# Patient Record
Sex: Female | Born: 1985 | Race: Black or African American | Hispanic: No | Marital: Single | Smoking: Never smoker
Health system: Southern US, Community
[De-identification: ages and names within clinical notes are randomized; demographics above are authoritative.]

---

## 2013-05-12 ENCOUNTER — Emergency Department (HOSPITAL_COMMUNITY): Payer: BC Managed Care – PPO

## 2013-05-12 ENCOUNTER — Emergency Department (HOSPITAL_COMMUNITY)
Admission: EM | Admit: 2013-05-12 | Discharge: 2013-05-12 | Disposition: A | Discharge status: Home / Self Care | Payer: BC Managed Care – PPO | Attending: Emergency Medicine | Admitting: Emergency Medicine

## 2013-05-12 ENCOUNTER — Encounter (HOSPITAL_COMMUNITY): Payer: Self-pay | Admitting: Emergency Medicine

## 2013-05-12 DIAGNOSIS — S93401A Sprain of unspecified ligament of right ankle, initial encounter: Secondary | ICD-10-CM

## 2013-05-12 DIAGNOSIS — Y9302 Activity, running: Secondary | ICD-10-CM | POA: Insufficient documentation

## 2013-05-12 DIAGNOSIS — W010XXA Fall on same level from slipping, tripping and stumbling without subsequent striking against object, initial encounter: Secondary | ICD-10-CM | POA: Insufficient documentation

## 2013-05-12 DIAGNOSIS — Y929 Unspecified place or not applicable: Secondary | ICD-10-CM | POA: Insufficient documentation

## 2013-05-12 DIAGNOSIS — S93409A Sprain of unspecified ligament of unspecified ankle, initial encounter: Secondary | ICD-10-CM | POA: Insufficient documentation

## 2013-05-12 NOTE — ED Notes (Addendum)
Pt reports slipped and fell this am in the rain. Strained R ankle. C/o ankle pain and swelling, swelling noted, some bruising present. H/o similar multiple times. Pain mostly at ankle. Wearing flip flops (sandles). No meds PTA. CMS intact.

## 2013-05-12 NOTE — ED Notes (Signed)
Patient transported to X-ray 

## 2013-05-12 NOTE — ED Notes (Signed)
PT ambulated with baseline gait; VSS; A&Ox3; no signs of distress; respirations even and unlabored; skin warm and dry; no questions upon discharge.  

## 2013-05-12 NOTE — ED Notes (Signed)
Ortho tech at bedside 

## 2013-05-12 NOTE — ED Notes (Signed)
Ortho paged. 

## 2013-05-12 NOTE — Progress Notes (Signed)
Orthopedic Tech Progress Note Patient Details:  Burnard BuntingObianuju Grzelak 06-23-1985 [id number]  Ortho Devices Type of Ortho Device: CAM walker;Crutches Ortho Device/Splint Location: RLE Ortho Device/Splint Interventions: Ordered;Application;Adjustment   Jennye MoccasinHughes, Nalia Honeycutt Craig 05/12/2013, 10:54 PM

## 2013-05-12 NOTE — ED Provider Notes (Signed)
CSN: [id number]     Arrival date & time 05/12/13  2017 History   This chart was scribed for non-physician practitioner Arthor CaptainAbigail Houston Surges, PA-C, working with Toy BakerAnthony T Allen, MD by Nicholos Johnsenise Iheanachor, ED scribe. This patient was seen in room TR09C/TR09C and the patient's care was started at 10:17 PM.  Chief Complaint  Patient presents with  . Ankle Pain  . Joint Swelling   The history is provided by the patient. No language interpreter was used.   HPI Comments: Mercedes Jones is a 28 y.o. female who presents to the Emergency Department complaining of constant, gradually worsening right ankle pain w/ associated swelling. Pt states the foot feels tense and hurts with applied pressure. Pt was running to class and slid in mud and fell.  History reviewed. No pertinent past medical history. History reviewed. No pertinent past surgical history. No family history on file. History  Substance Use Topics  . Smoking status: Never Smoker   . Smokeless tobacco: Not on file  . Alcohol Use: No   OB History   Grav Para Term Preterm Abortions TAB SAB Ect Mult Living                 Review of Systems  Constitutional: Negative for fever and chills.  HENT: Negative for rhinorrhea and sore throat.   Musculoskeletal: Positive for arthralgias. Negative for myalgias.  Skin: Negative for color change and rash.  Neurological: Negative for weakness and numbness.    Allergies  Review of patient's allergies indicates no known allergies.  Home Medications  No current outpatient prescriptions on file. Triage Vitals: BP 124/74  Pulse 78  Temp(Src) 98.9 F (37.2 C) (Oral)  Resp 20  SpO2 97%  LMP 04/11/2013 Physical Exam  Nursing note and vitals reviewed. Constitutional: She is oriented to person, place, and time. She appears well-developed and well-nourished. No distress.  HENT:  Head: Normocephalic and atraumatic.  Eyes: Conjunctivae and EOM are normal. Pupils are equal, round, and reactive to light.   Neck: Neck supple. No thyromegaly present.  Cardiovascular: Normal rate.   Pulmonary/Chest: Effort normal. No respiratory distress.  Musculoskeletal: Normal range of motion.  Lymphadenopathy:    She has no cervical adenopathy.  Neurological: She is alert and oriented to person, place, and time.  Skin: Skin is warm and dry.  Psychiatric: She has a normal mood and affect. Her behavior is normal.    ED Course  Procedures (including critical care time) DIAGNOSTIC STUDIES: Oxygen Saturation is 97% on room air, normal by my interpretation.    COORDINATION OF CARE: At 10:19 PM: Discussed treatment plan with patient which includes a boot and crutches to avoid bearing weight. Pt is advised to elevate the foot to decrease pain. Patient agrees.   Labs Review Labs Reviewed - No data to display Imaging Review Dg Ankle Complete Right  05/12/2013   CLINICAL DATA:  Twisting ankle injury with lateral pain  EXAM: RIGHT ANKLE - COMPLETE 3+ VIEW  COMPARISON:  None.  FINDINGS: Periarticular soft tissue swelling, especially laterally. No evidence of fracture or malalignment. No joint narrowing.  IMPRESSION: No acute osseous abnormality.   Electronically Signed   By: Tiburcio PeaJonathan  Watts M.D.   On: 05/12/2013 22:07    EKG Interpretation   None       MDM   1. Right ankle sprain    Patient X-Ray negative for obvious fracture or dislocation. Pain managed in ED. Pt advised to follow up with orthopedics if symptoms persist for possibility of  missed fracture diagnosis. Patient given brace while in ED, conservative therapy recommended and discussed. Patient will be dc home & is agreeable with above plan.  I personally performed the services described in this documentation, which was scribed in my presence. The recorded information has been reviewed and is accurate.       Arthor Captain, PA-C 05/14/13 1109

## 2013-05-12 NOTE — ED Notes (Signed)
Ice applied, declined pain med.

## 2013-05-12 NOTE — Discharge Instructions (Signed)

## 2013-05-17 NOTE — ED Provider Notes (Signed)
Medical screening examination/treatment/procedure(s) were performed by non-physician practitioner and as supervising physician I was immediately available for consultation/collaboration.  Saksham Akkerman T Xitlali Kastens, MD 05/17/13 1644 

## 2015-06-26 IMAGING — CR DG ANKLE COMPLETE 3+V*R*
3 series · 3 of 3 positions shown · non-contrast
Comparison: None.

CLINICAL DATA: Twisting ankle injury with lateral pain

EXAM:
RIGHT ANKLE - COMPLETE 3+ VIEW

[x ankle ap right]
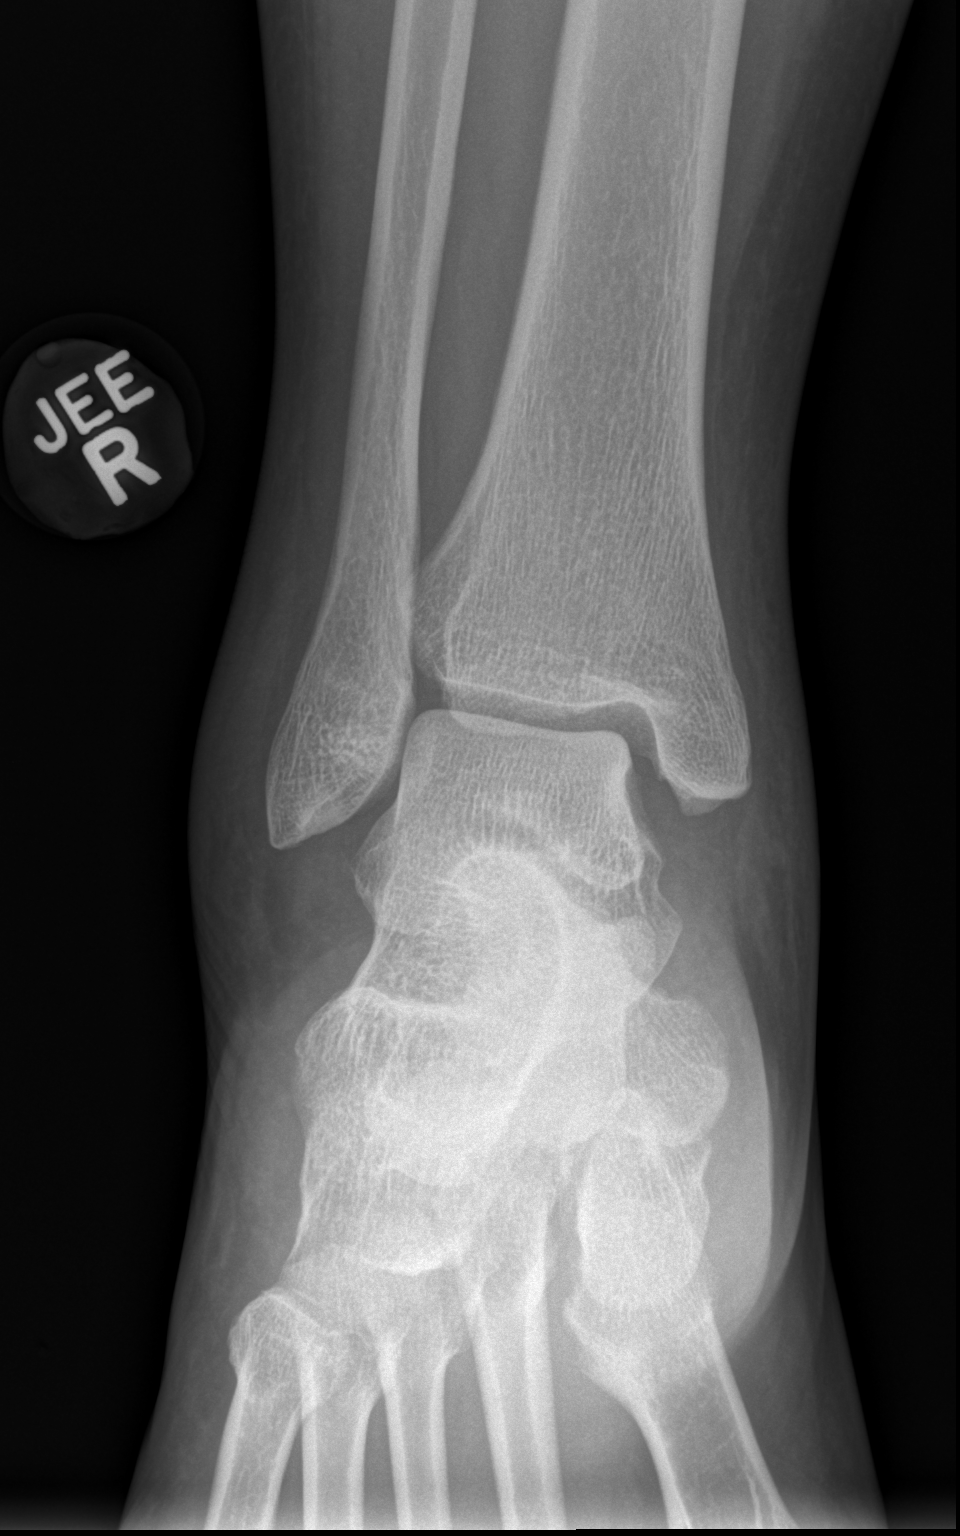

[x ankle obl right]
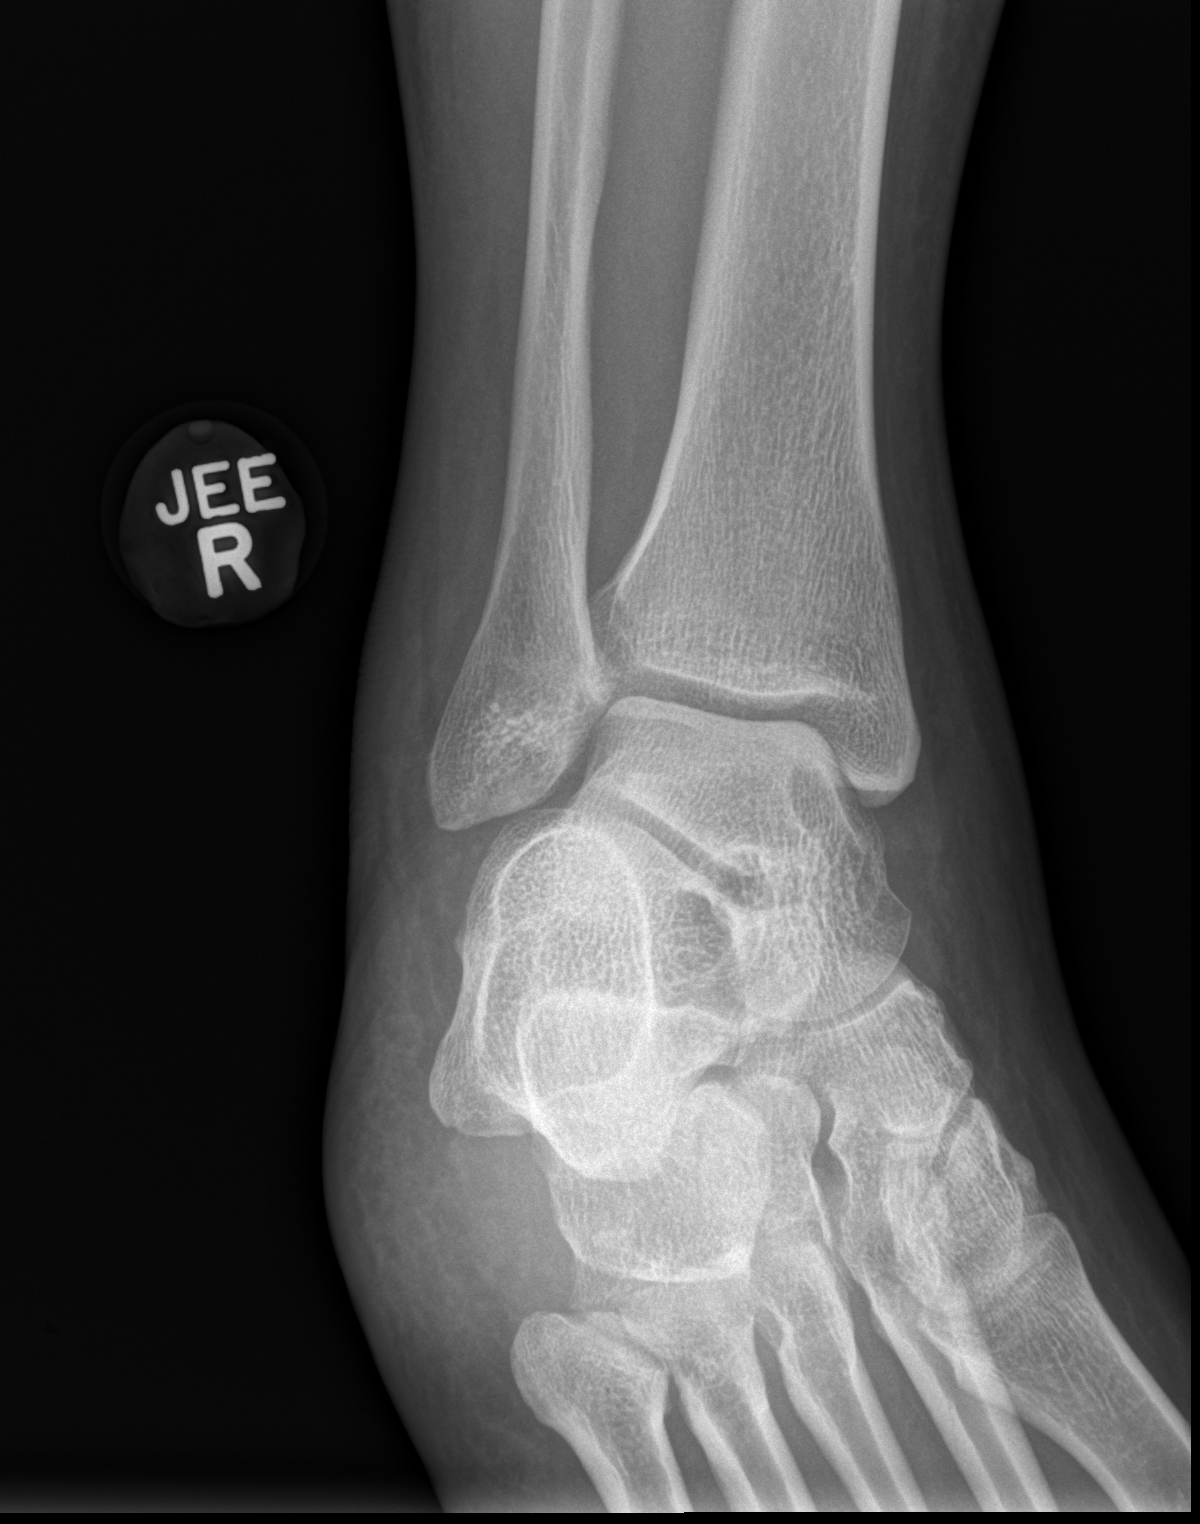

[x ankle lat right]
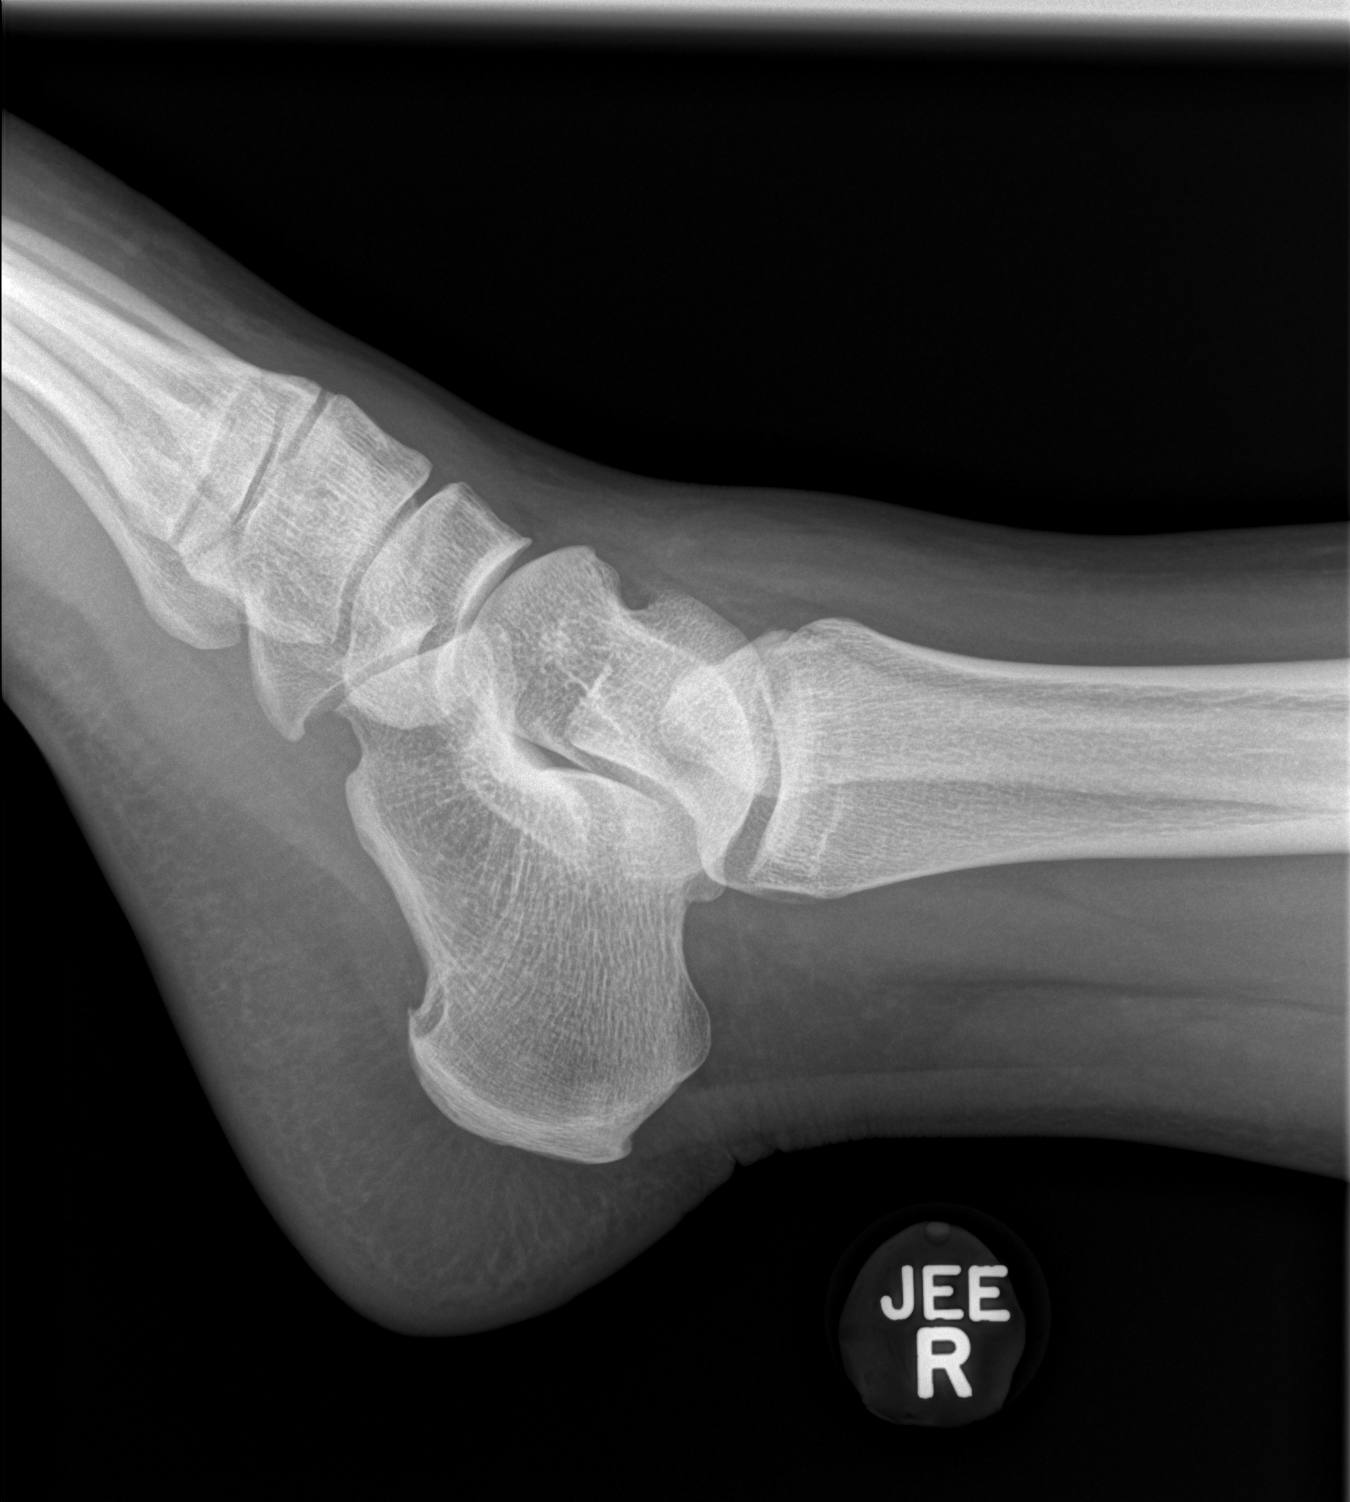

[3 of 3 positions shown; findings below may reference images not displayed]

FINDINGS: Periarticular soft tissue swelling, especially laterally. No
evidence of fracture or malalignment. No joint narrowing.
IMPRESSION: No acute osseous abnormality.
